# Patient Record
Sex: Male | Born: 1985 | Race: Black or African American | Hispanic: No | Marital: Single | Smoking: Never smoker
Health system: Southern US, Community
[De-identification: ages and names within clinical notes are randomized; demographics above are authoritative.]

---

## 2008-09-13 ENCOUNTER — Emergency Department (HOSPITAL_COMMUNITY): Admission: EM | Admit: 2008-09-13 | Discharge: 2008-09-13 | Payer: Self-pay | Admitting: Emergency Medicine

## 2009-10-25 ENCOUNTER — Emergency Department: Payer: Self-pay | Admitting: Emergency Medicine

## 2011-02-12 NOTE — Op Note (Signed)
Allen Waters, Allen Waters NO.:  [id number]   MEDICAL RECORD NO.:  [id number]          PATIENT TYPE:  EMS   LOCATION:  ED                           FACILITY:  [facility]   PHYSICIAN:  Dionne Ano. Gramig, M.D.DATE OF BIRTH:  07-16-1986   DATE OF PROCEDURE:  DATE OF DISCHARGE:                               OPERATIVE REPORT   Allen Waters is an extremely pleasant 25 year old gentleman who we  contacted in regards to his left upper extremity.  He presented to the  [facility] emergency room setting with pain, soft tissue swelling, and  redness about the dorsal aspect of the left index finger beginning this  past Saturday.  He denies any incidents of injury or trauma, or bite  wounds.  He states he was at work.  He states he reached under his desk,  cleaning it out, and felt a slight burning sensation but had no obvious  wound or penetrating injury to his knowledge.  He states that he had no  difficulties until Sunday as he started having some slight swelling and  tenderness.  Monday this increased to the extent, today, he states it  was quite painful and tender.  I should note that the patient did state  he had a prior similar episode when he developed a boil about his  right calf 2 months ago.  He was seen and treated at [facility] and per his description this was suspicious for MRSA.  He was  given doxycycline.  This went on to resolve without difficulties.  He  denies any numbness or tingling of the upper extremity.   PHYSICAL EXAMINATION:  GENERAL:  He is very pleasant, no acute distress,  alert and oriented, appearing his stated age.  VITAL SIGNS:  He has mild leukocytosis at 11.2; otherwise, he is  afebrile and his vital signs are stable.  EXTREMITIES:  Focused exam of the hand/index finger of the left upper  extremity shows that he has a localized erythema between the PIP and the  MP joint dorsally.  There appears to be an evolving abscess with  fluctuance present without gross purulence.  His skin appears intact.  He is able to extend the finger without pain, both actively and  passively.  He is nontender about the volar aspect of the hand.  He has  no swelling about the volar digit, thenar, midpalmar, or hypothenar  space.  He has negative Kanavel signs.  MP joint dorsally is nontender.  He is quite tender, localized, over the area of the evolving abscess.   Radiographs were reviewed and showed no acute bony abnormalities or  foreign objects that define.   ASSESSMENT:  Left index finger dorsal abscess.   PLAN:  We have discussed with Allen Waters, given the nature of his  upper extremity predicament, we will recommend I and D.  After obtaining  global consent, he underwent the following:  1. Left index finger digital block with local field block dorsally.  2. I and D of skin and subcutaneous tissues of the left dorsal index      finger.  The patient tolerated the procedure well.  His wounds were packed.  Soft  dressings were applied.  He was noted to have purulent material present.  This was copiously irrigated and debrided with packing applied and soft  dressings.  He had full range of motion after the procedure.  This did  not appear to violate his extensor tendon sheath and he had excellent  extension against resisted testing at the close of the procedure.  We  discussed with him diligent elevation, keeping his dressing clean, dry,  and intact.  Prior to being discontinued from the emergency room he will  be given 1 g of vancomycin.  He will follow up with p.o. antibiotics in  the form of doxycycline.  For pain medication we have dispensed Percocet  500 mg 1 p.o. q.4-6 p.r.n. pain.  We have discussed with him taking over  the  counter vitamin C 500 mg 1 twice daily and over the counter stool  softener.  He will see a therapist tomorrow at 2 p.m. for whirlpools and  packing.  Intraoperative cultures were obtained and  are pending.  He  will call our office at [phone] for any questions or concerns.      Karie Chimera, P.A.-C.      Dionne Ano. Amanda Pea, M.D.  Electronically Signed    BB/MEDQ  D:  09/13/2008  T:  09/13/2008  Job:  [id number]   cc:   Dionne Ano. Amanda Pea, M.D.  Fax: [phone]
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
## 2011-07-05 LAB — CBC
HCT: 40.6 % (ref 39.0–52.0)
Hemoglobin: 13.8 g/dL (ref 13.0–17.0)
Platelets: 237 10*3/uL (ref 150–400)
WBC: 11.2 10*3/uL — ABNORMAL HIGH (ref 4.0–10.5)

## 2011-07-05 LAB — DIFFERENTIAL
Basophils Absolute: 0 10*3/uL (ref 0.0–0.1)
Basophils Relative: 0 % (ref 0–1)
Eosinophils Absolute: 0.1 10*3/uL (ref 0.0–0.7)
Lymphocytes Relative: 15 % (ref 12–46)
Monocytes Absolute: 0.5 10*3/uL (ref 0.1–1.0)
Monocytes Relative: 4 % (ref 3–12)
Neutro Abs: 8.9 10*3/uL — ABNORMAL HIGH (ref 1.7–7.7)

## 2011-07-05 LAB — WOUND CULTURE

## 2011-07-05 LAB — GRAM STAIN

## 2011-07-05 LAB — BASIC METABOLIC PANEL
GFR calc non Af Amer: >60 mL/min (ref >60)
Potassium: 3.6 mEq/L (ref 3.5–5.1)
Sodium: 138 mEq/L (ref 135–145)

## 2021-12-09 NOTE — Progress Notes (Incomplete)
12/11/2021 5:18 PM   Allen Waters December 05, 1985 [id number]  Referring provider: No referring provider defined for this encounter.  No chief complaint on file.    HPI: Allen Waters is a 36 y.o. male who presents today for further evaluation of vasectomy.    He denies a history of testicular trauma or pain.  No urinary issues.  No previous scrotal surgeries.   PMH: No past medical history on file.  Surgical History: *** The histories are not reviewed yet. Please review them in the "History" navigator section and refresh this SmartLink.  Home Medications:  Allergies as of 12/11/2021   Not on File      Medication List    as of December 09, 2021  5:18 PM   You have not been prescribed any medications.     Allergies: Not on File  Family History: No family history on file.  Social History:  has no history on file for tobacco use, alcohol use, and drug use.   Physical Exam: There were no vitals taken for this visit.  Constitutional:  Alert and oriented, No acute distress. HEENT: [place] AT, moist mucus membranes.  Trachea midline, no masses. Cardiovascular: No clubbing, cyanosis, or edema. Respiratory: Normal respiratory effort, no increased work of breathing. GI: Abdomen is soft, nontender, nondistended, no abdominal masses GU: Normal phallus.  Bilateral descended testicles without masses.  Vasa easily palpable bilaterally. Skin: No rashes, bruises or suspicious lesions. Neurologic: Grossly intact, no focal deficits, moving all 4 extremities. Psychiatric: Normal mood and affect.   Assessment & Plan:    1. Vasectomy evaluation Today, we discussed what the vas deferens is, where it is located, and its function. We reviewed the procedure for vasectomy, it's risks, benefits, alternatives, and likelihood of achieving his goals. We discussed in detail the procedure, complications, and recovery as well as the need for clearance prior to unprotected intercourse. We  discussed that vasectomy does not protect against sexually transmitted diseases. We discussed that this procedure does not result in immediate sterility and that they would need to use other forms of birth control until he has been cleared with negative postvasectomy semen analyses. I explained that the procedure is considered to be permanent and that attempts at reversal have varying degrees of success. These options include vasectomy reversal, sperm retrieval, and in vitro fertilization; these can be very expensive. We discussed the chance of postvasectomy pain syndrome which occurs in less than 5% of patients. I explained to the patient that there is no treatment to resolve this chronic pain, and that if it developed I would not be able to help resolve the issue, but that surgery is generally not needed for correction. I explained there have even been reports of systemic like illness associated with this chronic pain, and that there was no good cure. I explained that vasectomy it is not a 100% reliable form of birth control, and the risk of pregnancy after vasectomy is approximately 1 in 2000 men who had a negative postvasectomy semen analysis or rare non-motile sperm. I explained that repeat vasectomy was necessary in less than 1% of vasectomy procedures when employing the type of technique that I use. I explained that he should refrain from ejaculation for approximately one week following vasectomy. I explained that there are other options for birth control which are permanent and non-permanent; we discussed these. I explained the rates of surgical complications, such as symptomatic hematoma or infection, are low (1-2%) and vary with the surgeon's experience  and criteria used to diagnose the complication.   The patient had the opportunity to ask questions to his stated satisfaction. He voiced understanding of the above factors and stated that he has read all the information provided to him and the packets and  informed consent.  He is interested in receiving of Valium 10 mg prior to the procedure for the purpose of anxiolysis.  A prescription was given today.  He will have a driver on the day of the procedure.   No follow-ups on file.  I,Kailey Littlejohn,acting as a Neurosurgeon for Vanna Scotland, MD.,have documented all relevant documentation on the behalf of Allen Pavey, MD,as directed by  Vanna Scotland, MD while in the presence of Vanna Scotland, MD.   [facility] [address] [place], [place] [address] [phone]

## 2021-12-11 ENCOUNTER — Ambulatory Visit (INDEPENDENT_AMBULATORY_CARE_PROVIDER_SITE_OTHER): Payer: Self-pay | Admitting: Urology

## 2021-12-11 ENCOUNTER — Other Ambulatory Visit: Payer: Self-pay

## 2021-12-11 ENCOUNTER — Encounter: Payer: Self-pay | Admitting: Urology

## 2021-12-11 VITALS — BP 140/82 | HR 76 | Ht 69.0 in | Wt 185.0 lb

## 2021-12-11 DIAGNOSIS — Z3009 Encounter for other general counseling and advice on contraception: Secondary | ICD-10-CM

## 2021-12-11 MED ORDER — DIAZEPAM 10 MG PO TABS: 10.0000 mg | ORAL_TABLET | Freq: Once | ORAL | 0 refills | Status: AC

## 2021-12-11 NOTE — Patient Instructions (Signed)

## 2022-02-18 NOTE — Progress Notes (Incomplete)
02/19/2022  CC: No chief complaint on file.   HPI: Allen Waters is a 36 y.o. male who presents today for vasectomy.    He denies a history of testicular trauma or pain. No urinary issues.  No previous scrotal surgeries.   He has 3 children.  There were no vitals filed for this visit. NED. A&Ox3.   No respiratory distress   Abd soft, NT, ND Normal external genitalia with patent urethral meatus   Bilateral Vasectomy Procedure  Pre-Procedure: - Patient's scrotum was prepped and draped for vasectomy. - The vas was palpated through the scrotal skin on the left. - 1% Xylocaine was injected into the skin and surrounding tissue for placement  - In a similar manner, the vas on the right was identified, anesthetized, and stabilized.  Procedure: - A #11 blade was used to make a small stab incision in the skin overlying the vas - The left vas was isolated and brought up through the incision exposing that structure. - Bleeding points were cauterized as they occurred. - The vas was free from the surrounding structures and brought to the view. - A segment was positioned for placement with a hemostat. - A second hemostat was placed and a small segment between the two hemostats and was removed for inspection. - Each end of the transected vas lumen was fulgurated/ obliterated using needlepoint electrocautery -A fascial interposition was performed on testicular end of the vas using #3-0 chromic suture -The same procedure was performed on the right. - A single suture of #3-0 chromic catgut was used to close each lateral scrotal skin incision - A dressing was applied.  Post-Procedure: - Patient was instructed in care of the operative area - A specimen is to be delivered in 12 weeks   -Another form of contraception is to be used until post vasectomy semen analysis

## 2022-02-19 ENCOUNTER — Encounter: Payer: Self-pay | Admitting: Urology

## 2022-11-28 ENCOUNTER — Other Ambulatory Visit: Payer: Self-pay | Admitting: Urology

## 2022-11-28 MED ORDER — DIAZEPAM 10 MG PO TABS: 10.0000 mg | ORAL_TABLET | Freq: Once | ORAL | 0 refills | Status: AC

## 2022-11-28 NOTE — Telephone Encounter (Signed)
Patient advised that RX was sent in

## 2022-11-28 NOTE — Telephone Encounter (Signed)
Called pharmacy and was advised RX for Diazepam that was sent March 2023 was expired, sending to Dr Erlene Quan to review. Medication pended

## 2022-11-28 NOTE — Telephone Encounter (Signed)
Can you please confirm that valium was sent to [facility] on [address] prior to appt for vasectomy tomorrow in [place].

## 2022-11-29 ENCOUNTER — Ambulatory Visit (INDEPENDENT_AMBULATORY_CARE_PROVIDER_SITE_OTHER): Payer: Managed Care, Other (non HMO) | Admitting: Urology

## 2022-11-29 ENCOUNTER — Encounter: Payer: Self-pay | Admitting: Urology

## 2022-11-29 VITALS — BP 117/72 | HR 72 | Ht 69.0 in | Wt 187.5 lb

## 2022-11-29 DIAGNOSIS — Z9852 Vasectomy status: Secondary | ICD-10-CM

## 2022-11-29 DIAGNOSIS — Z302 Encounter for sterilization: Secondary | ICD-10-CM

## 2022-11-29 NOTE — Progress Notes (Signed)
11/29/22  CC:  Chief Complaint  Patient presents with   VAS    HPI: 37 year old male who presents today for vasectomy.  Blood pressure 117/72, pulse 72, height '5\' 9"'$  (1.753 m), weight 187 lb 8 oz (85 kg). NED. A&Ox3.   No respiratory distress   Abd soft, NT, ND Normal external genitalia with patent urethral meatus  A timeout was performed.  Patient's identity and consent was confirmed.  All questions were answered.   Bilateral Vasectomy Procedure  Pre-Procedure: - Patient's scrotum was prepped and draped for vasectomy. - The vas was palpated through the scrotal skin on the left. - 1% Xylocaine was injected into the skin and surrounding tissue for placement  - In a similar manner, the vas on the right was identified, anesthetized, and stabilized.  Procedure: - A #11 blade was used to make a small stab incision in the skin overlying the vas - The left vas was isolated and brought up through the incision exposing that structure. - Bleeding points were cauterized as they occurred. - The vas was free from the surrounding structures and brought to the view. - A segment was positioned for placement with a hemostat. - A second hemostat was placed and a small segment between the two hemostats and was removed for inspection. - Each end of the transected vas lumen was fulgurated/ obliterated using needlepoint electrocautery -A fascial interposition was performed on testicular end of the vas using #3-0 chromic suture -The same procedure was performed on the right. - A single suture of #3-0 chromic catgut was used to close each lateral scrotal skin incision - A dressing was applied.  Post-Procedure: - Patient was instructed in care of the operative area - A specimen is to be delivered in 12 weeks   -Another form of contraception is to be used until post vasectomy semen analysis  Hollice Espy, MD

## 2022-11-29 NOTE — Patient Instructions (Signed)

## 2023-03-05 ENCOUNTER — Other Ambulatory Visit: Payer: Managed Care, Other (non HMO)

## 2023-03-05 DIAGNOSIS — Z9852 Vasectomy status: Secondary | ICD-10-CM

## 2023-03-05 NOTE — Addendum Note (Signed)
Addended by: Consuella Lose on: 03/05/2023 03:17 PM   Modules accepted: Orders

## 2023-03-06 LAB — POST-VAS SPERM EVALUATION,QUAL: Volume: 1.2 mL

## 2023-03-07 ENCOUNTER — Other Ambulatory Visit: Payer: Self-pay

## 2023-03-07 DIAGNOSIS — Z9852 Vasectomy status: Secondary | ICD-10-CM

## 2023-04-08 ENCOUNTER — Other Ambulatory Visit: Payer: Managed Care, Other (non HMO)

## 2023-04-10 ENCOUNTER — Other Ambulatory Visit: Payer: Managed Care, Other (non HMO)

## 2023-04-10 DIAGNOSIS — Z9852 Vasectomy status: Secondary | ICD-10-CM

## 2023-04-11 LAB — POST-VAS SPERM EVALUATION,QUAL: Volume: 1.2 mL

## 2023-04-24 ENCOUNTER — Other Ambulatory Visit: Payer: Self-pay | Admitting: Urology

## 2023-05-25 ENCOUNTER — Emergency Department
Admission: EM | Admit: 2023-05-25 | Discharge: 2023-05-25 | Disposition: A | Discharge status: Home / Self Care | Payer: Managed Care, Other (non HMO) | Attending: Emergency Medicine | Admitting: Emergency Medicine

## 2023-05-25 ENCOUNTER — Emergency Department: Payer: Managed Care, Other (non HMO)

## 2023-05-25 ENCOUNTER — Other Ambulatory Visit: Payer: Self-pay

## 2023-05-25 ENCOUNTER — Emergency Department: Payer: 59

## 2023-05-25 DIAGNOSIS — X58XXXA Exposure to other specified factors, initial encounter: Secondary | ICD-10-CM | POA: Diagnosis not present

## 2023-05-25 DIAGNOSIS — S02119A Unspecified fracture of occiput, initial encounter for closed fracture: Secondary | ICD-10-CM | POA: Insufficient documentation

## 2023-05-25 DIAGNOSIS — S065XAA Traumatic subdural hemorrhage with loss of consciousness status unknown, initial encounter: Secondary | ICD-10-CM

## 2023-05-25 DIAGNOSIS — S0280XA Fracture of other specified skull and facial bones, unspecified side, initial encounter for closed fracture: Secondary | ICD-10-CM

## 2023-05-25 DIAGNOSIS — S065X0A Traumatic subdural hemorrhage without loss of consciousness, initial encounter: Secondary | ICD-10-CM | POA: Insufficient documentation

## 2023-05-25 DIAGNOSIS — S0990XA Unspecified injury of head, initial encounter: Secondary | ICD-10-CM | POA: Diagnosis present

## 2023-05-25 LAB — URINE DRUG SCREEN, QUALITATIVE (ARMC ONLY)
Amphetamines, Ur Screen: NOT DETECTED
Barbiturates, Ur Screen: NOT DETECTED
Benzodiazepine, Ur Scrn: NOT DETECTED
Cannabinoid 50 Ng, Ur ~~LOC~~: NOT DETECTED
Cocaine Metabolite,Ur ~~LOC~~: NOT DETECTED
MDMA (Ecstasy)Ur Screen: NOT DETECTED
Methadone Scn, Ur: NOT DETECTED
Opiate, Ur Screen: NOT DETECTED
Phencyclidine (PCP) Ur S: NOT DETECTED
Tricyclic, Ur Screen: NOT DETECTED

## 2023-05-25 LAB — BASIC METABOLIC PANEL
Anion gap: 12 (ref 5–15)
BUN: 17 mg/dL (ref 6–20)
CO2: 23 mmol/L (ref 22–32)
Calcium: 9.4 mg/dL (ref 8.9–10.3)
Chloride: 104 mmol/L (ref 98–111)
Creatinine, Ser: 0.94 mg/dL (ref 0.61–1.24)
GFR, Estimated: >60 mL/min (ref >60)
Glucose, Bld: 146 mg/dL — ABNORMAL HIGH (ref 70–99)
Potassium: 3.7 mmol/L (ref 3.5–5.1)
Sodium: 139 mmol/L (ref 135–145)

## 2023-05-25 LAB — CBC
HCT: 43.9 % (ref 39.0–52.0)
Hemoglobin: 14.9 g/dL (ref 13.0–17.0)
MCH: 29.2 pg (ref 26.0–34.0)
MCHC: 33.9 g/dL (ref 30.0–36.0)
MCV: 85.9 fL (ref 80.0–100.0)
Platelets: 292 10*3/uL (ref 150–400)
RBC: 5.11 MIL/uL (ref 4.22–5.81)
RDW: 12.7 % (ref 11.5–15.5)
WBC: 15.3 10*3/uL — ABNORMAL HIGH (ref 4.0–10.5)
nRBC: 0 % (ref 0.0–0.2)

## 2023-05-25 LAB — URINALYSIS, ROUTINE W REFLEX MICROSCOPIC
Bacteria, UA: NONE SEEN
Bilirubin Urine: NEGATIVE
Glucose, UA: NEGATIVE mg/dL
Ketones, ur: NEGATIVE mg/dL
Leukocytes,Ua: NEGATIVE
Nitrite: NEGATIVE
Protein, ur: 30 mg/dL — AB
Specific Gravity, Urine: 1.041 — ABNORMAL HIGH (ref 1.005–1.030)
Squamous Epithelial / HPF: NONE SEEN /HPF (ref 0–5)
pH: 6 (ref 5.0–8.0)

## 2023-05-25 LAB — MAGNESIUM: Magnesium: 2 mg/dL (ref 1.7–2.4)

## 2023-05-25 LAB — CK: Total CK: 314 U/L (ref 49–397)

## 2023-05-25 MED ORDER — MORPHINE SULFATE (PF) 4 MG/ML IV SOLN
4.0000 mg | Freq: Once | INTRAVENOUS | Status: AC
  Administered 2023-05-25: 4 mg via INTRAVENOUS
  Filled 2023-05-25: qty 1

## 2023-05-25 MED ORDER — GADOBUTROL 1 MMOL/ML IV SOLN
7.5000 mL | Freq: Once | INTRAVENOUS | Status: AC | PRN
  Administered 2023-05-25: 7.5 mL via INTRAVENOUS

## 2023-05-25 MED ORDER — ONDANSETRON HCL 4 MG/2ML IJ SOLN
4.0000 mg | Freq: Once | INTRAMUSCULAR | Status: AC
  Administered 2023-05-25: 4 mg via INTRAVENOUS
  Filled 2023-05-25: qty 2

## 2023-05-25 MED ORDER — ONDANSETRON 4 MG PO TBDP
4.0000 mg | ORAL_TABLET | Freq: Once | ORAL | Status: AC
  Administered 2023-05-25: 4 mg via ORAL
  Filled 2023-05-25: qty 1

## 2023-05-25 MED ORDER — SODIUM CHLORIDE 0.9 % IV SOLN
750.0000 mg | Freq: Two times a day (BID) | INTRAVENOUS | Status: DC
  Administered 2023-05-25: 750 mg via INTRAVENOUS
  Filled 2023-05-25 (×2): qty 7.5

## 2023-05-25 MED ORDER — FENTANYL CITRATE PF 50 MCG/ML IJ SOSY
50.0000 ug | PREFILLED_SYRINGE | Freq: Once | INTRAMUSCULAR | Status: AC
  Administered 2023-05-25: 50 ug via INTRAVENOUS
  Filled 2023-05-25: qty 1

## 2023-05-25 NOTE — ED Notes (Signed)
Transfer of facility report called to Rayford Halsted at [facility] ED.  Pt Dx, Hx, Sx, current condition, lines, labs, and imaging discussed.  All questions asked were answered.   [facility] transport, Alinda Money, called this RN for transfer of care report.  Pt Dx, Hx, Sx, current condition, lines, labs, and imaging discussed.  All questions asked were answered.
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
## 2023-05-25 NOTE — ED Notes (Signed)
[facility] en route.  °

## 2023-05-25 NOTE — ED Notes (Signed)
Pt gave permission for RN to return call to [facility] and wife.  MIL updated about pt current Dx, condition.  All questions asked were answered.  Pt MIL states she will relay info to pt wife.

## 2023-05-25 NOTE — ED Triage Notes (Signed)
First nurse note: Pt to ED via POV from [facility]. Pt reports dizziness and N/V that started yesterday. Pt sent from [facility] for possible dehydration and fluid resuscitation.

## 2023-05-25 NOTE — ED Notes (Signed)
[facility] called back and stated they are at capacity

## 2023-05-25 NOTE — ED Notes (Signed)
Pt accepted to [facility] ED per Marcelle Smiling, coordinator. Call report to [phone] opt 2. Accepting is Dr Dorie Rank. [facility] will get a truck out as soon as they can.

## 2023-06-16 ENCOUNTER — Other Ambulatory Visit: Payer: Self-pay | Admitting: Family Medicine

## 2023-06-16 DIAGNOSIS — R519 Headache, unspecified: Secondary | ICD-10-CM

## 2023-06-16 DIAGNOSIS — I619 Nontraumatic intracerebral hemorrhage, unspecified: Secondary | ICD-10-CM

## 2023-06-23 ENCOUNTER — Ambulatory Visit
Admission: RE | Admit: 2023-06-23 | Discharge: 2023-06-23 | Disposition: A | Discharge status: Home / Self Care | Payer: 59 | Source: Ambulatory Visit | Attending: Family Medicine | Admitting: Family Medicine

## 2023-06-23 DIAGNOSIS — I619 Nontraumatic intracerebral hemorrhage, unspecified: Secondary | ICD-10-CM

## 2023-06-23 DIAGNOSIS — R519 Headache, unspecified: Secondary | ICD-10-CM
